# Patient Record
Sex: Male | Born: 1960 | Race: White | Hispanic: No | Marital: Single | Smoking: Current every day smoker
Health system: Southern US, Community
[De-identification: ages and names within clinical notes are randomized; demographics above are authoritative.]

## PROBLEM LIST (undated history)

## (undated) HISTORY — PX: FOOT SURGERY: SHX648

---

## 2020-06-18 ENCOUNTER — Emergency Department
Admission: EM | Admit: 2020-06-18 | Discharge: 2020-06-18 | Disposition: A | Discharge status: Home / Self Care | Payer: Self-pay | Attending: Emergency Medicine | Admitting: Emergency Medicine

## 2020-06-18 ENCOUNTER — Emergency Department: Payer: Self-pay

## 2020-06-18 ENCOUNTER — Other Ambulatory Visit: Payer: Self-pay

## 2020-06-18 DIAGNOSIS — M79601 Pain in right arm: Secondary | ICD-10-CM | POA: Insufficient documentation

## 2020-06-18 DIAGNOSIS — M542 Cervicalgia: Secondary | ICD-10-CM | POA: Insufficient documentation

## 2020-06-18 DIAGNOSIS — M79602 Pain in left arm: Secondary | ICD-10-CM | POA: Insufficient documentation

## 2020-06-18 DIAGNOSIS — Y92410 Unspecified street and highway as the place of occurrence of the external cause: Secondary | ICD-10-CM | POA: Insufficient documentation

## 2020-06-18 DIAGNOSIS — M79652 Pain in left thigh: Secondary | ICD-10-CM | POA: Insufficient documentation

## 2020-06-18 DIAGNOSIS — R519 Headache, unspecified: Secondary | ICD-10-CM | POA: Insufficient documentation

## 2020-06-18 DIAGNOSIS — M545 Low back pain, unspecified: Secondary | ICD-10-CM | POA: Insufficient documentation

## 2020-06-18 DIAGNOSIS — M79651 Pain in right thigh: Secondary | ICD-10-CM | POA: Insufficient documentation

## 2020-06-18 MED ORDER — ACETAMINOPHEN 325 MG PO TABS
650.0000 mg | ORAL_TABLET | Freq: Once | ORAL | Status: AC
  Administered 2020-06-18: 650 mg via ORAL
  Filled 2020-06-18: qty 2

## 2020-06-18 MED ORDER — METHOCARBAMOL 500 MG PO TABS
750.0000 mg | ORAL_TABLET | Freq: Once | ORAL | Status: AC
  Administered 2020-06-18: 750 mg via ORAL
  Filled 2020-06-18: qty 2

## 2020-06-18 MED ORDER — MELOXICAM 15 MG PO TABS: 15.0000 mg | ORAL_TABLET | Freq: Every day | ORAL | 0 refills | Status: AC

## 2020-06-18 MED ORDER — METHOCARBAMOL 750 MG PO TABS: 750.0000 mg | ORAL_TABLET | Freq: Four times a day (QID) | ORAL | 0 refills | Status: AC | PRN

## 2020-06-18 MED ORDER — MELOXICAM 7.5 MG PO TABS
15.0000 mg | ORAL_TABLET | Freq: Once | ORAL | Status: AC
  Administered 2020-06-18: 15 mg via ORAL
  Filled 2020-06-18: qty 2

## 2020-06-18 NOTE — Discharge Instructions (Signed)
Please take the medications as prescribed.  You may also use Tylenol, up to 4 times daily as needed for pain.  Please follow-up with primary care if you fail to improve, or return to the ER for any worsening of symptoms.
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
## 2020-06-18 NOTE — ED Notes (Signed)
Pt is standing at bedside when this RN entered room. Pt is acting anxious and questioning why he has a HA and what medications he was being given. I went over the medication and discussed what the medication was used for. Pt was curious on why he was not receiving anything stronger for a HA. I stated that typically HA are treat with non-opoid drugs. Pt stated that he and his significant other would seek care at another hospital to gain stronger medication for her HA.
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
## 2020-09-26 ENCOUNTER — Other Ambulatory Visit: Payer: Self-pay

## 2020-09-26 ENCOUNTER — Ambulatory Visit: Payer: Self-pay | Admitting: Family Medicine

## 2020-09-26 ENCOUNTER — Other Ambulatory Visit: Payer: Self-pay | Admitting: Family Medicine

## 2020-09-26 ENCOUNTER — Encounter: Payer: Self-pay | Admitting: Family Medicine

## 2020-09-26 VITALS — BP 162/84 | HR 88 | Ht 70.0 in | Wt 191.0 lb

## 2020-09-26 DIAGNOSIS — Z7689 Persons encountering health services in other specified circumstances: Secondary | ICD-10-CM

## 2020-09-26 DIAGNOSIS — Z125 Encounter for screening for malignant neoplasm of prostate: Secondary | ICD-10-CM

## 2020-09-26 DIAGNOSIS — M159 Polyosteoarthritis, unspecified: Secondary | ICD-10-CM

## 2020-09-26 DIAGNOSIS — M8949 Other hypertrophic osteoarthropathy, multiple sites: Secondary | ICD-10-CM | POA: Insufficient documentation

## 2020-09-26 DIAGNOSIS — Z0000 Encounter for general adult medical examination without abnormal findings: Secondary | ICD-10-CM

## 2020-09-26 DIAGNOSIS — Z13220 Encounter for screening for lipoid disorders: Secondary | ICD-10-CM

## 2020-09-26 DIAGNOSIS — Z131 Encounter for screening for diabetes mellitus: Secondary | ICD-10-CM
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
## 2021-03-04 ENCOUNTER — Emergency Department
Admission: EM | Admit: 2021-03-04 | Discharge: 2021-03-04 | Disposition: A | Discharge status: Home / Self Care | Payer: Self-pay | Attending: Emergency Medicine | Admitting: Emergency Medicine

## 2021-03-04 ENCOUNTER — Other Ambulatory Visit: Payer: Self-pay

## 2021-03-04 ENCOUNTER — Emergency Department: Payer: Self-pay

## 2021-03-04 DIAGNOSIS — R609 Edema, unspecified: Secondary | ICD-10-CM

## 2021-03-04 DIAGNOSIS — N451 Epididymitis: Secondary | ICD-10-CM | POA: Insufficient documentation

## 2021-03-04 DIAGNOSIS — N5089 Other specified disorders of the male genital organs: Secondary | ICD-10-CM

## 2021-03-04 LAB — URINALYSIS, ROUTINE W REFLEX MICROSCOPIC
Bilirubin Urine: NEGATIVE
Glucose, UA: 250 mg/dL — AB
Nitrite: NEGATIVE
Protein, ur: 30 mg/dL — AB
Specific Gravity, Urine: 1.02 (ref 1.005–1.030)
pH: 5.5 (ref 5.0–8.0)

## 2021-03-04 LAB — CBC WITH DIFFERENTIAL/PLATELET
Abs Immature Granulocytes: 0.14 10*3/uL — ABNORMAL HIGH (ref 0.00–0.07)
Basophils Absolute: 0.1 10*3/uL (ref 0.0–0.1)
Basophils Relative: 0 %
Eosinophils Absolute: 0.2 10*3/uL (ref 0.0–0.5)
Eosinophils Relative: 1 %
HCT: 45.9 % (ref 39.0–52.0)
Hemoglobin: 15.7 g/dL (ref 13.0–17.0)
Immature Granulocytes: 1 %
Lymphocytes Relative: 14 %
Lymphs Abs: 2.5 10*3/uL (ref 0.7–4.0)
MCH: 32.1 pg (ref 26.0–34.0)
MCHC: 34.2 g/dL (ref 30.0–36.0)
MCV: 93.9 fL (ref 80.0–100.0)
Monocytes Absolute: 1.4 10*3/uL — ABNORMAL HIGH (ref 0.1–1.0)
Monocytes Relative: 8 %
Neutro Abs: 14 10*3/uL — ABNORMAL HIGH (ref 1.7–7.7)
Neutrophils Relative %: 76 %
Platelets: 377 10*3/uL (ref 150–400)
RBC: 4.89 MIL/uL (ref 4.22–5.81)
RDW: 11.9 % (ref 11.5–15.5)
WBC: 18.2 10*3/uL — ABNORMAL HIGH (ref 4.0–10.5)
nRBC: 0 % (ref 0.0–0.2)

## 2021-03-04 LAB — BASIC METABOLIC PANEL
Anion gap: 7 (ref 5–15)
BUN: 21 mg/dL (ref 8–23)
CO2: 26 mmol/L (ref 22–32)
Calcium: 8.5 mg/dL — ABNORMAL LOW (ref 8.9–10.3)
Chloride: 102 mmol/L (ref 98–111)
Creatinine, Ser: 0.91 mg/dL (ref 0.61–1.24)
GFR, Estimated: >60 mL/min (ref >60)
Glucose, Bld: 143 mg/dL — ABNORMAL HIGH (ref 70–99)
Potassium: 3.4 mmol/L — ABNORMAL LOW (ref 3.5–5.1)
Sodium: 135 mmol/L (ref 135–145)

## 2021-03-04 LAB — HCG, QUANTITATIVE, PREGNANCY: hCG, Beta Chain, Quant, S: 1 m[IU]/mL (ref <5)

## 2021-03-04 LAB — LACTATE DEHYDROGENASE: LDH: 89 U/L — ABNORMAL LOW (ref 98–192)

## 2021-03-04 MED ORDER — STERILE WATER FOR INJECTION IJ SOLN
INTRAMUSCULAR | Status: AC
  Filled 2021-03-04: qty 10

## 2021-03-04 MED ORDER — OXYCODONE-ACETAMINOPHEN 5-325 MG PO TABS: 1.0000 | ORAL_TABLET | ORAL | 0 refills | Status: AC | PRN

## 2021-03-04 MED ORDER — CEFTRIAXONE SODIUM 1 G IJ SOLR
500.0000 mg | Freq: Once | INTRAMUSCULAR | Status: AC
  Administered 2021-03-04: 500 mg via INTRAMUSCULAR
  Filled 2021-03-04: qty 10

## 2021-03-04 MED ORDER — POTASSIUM CHLORIDE CRYS ER 20 MEQ PO TBCR
20.0000 meq | EXTENDED_RELEASE_TABLET | Freq: Once | ORAL | Status: AC
  Administered 2021-03-04: 20 meq via ORAL
  Filled 2021-03-04: qty 1

## 2021-03-04 MED ORDER — OXYCODONE-ACETAMINOPHEN 5-325 MG PO TABS
1.0000 | ORAL_TABLET | Freq: Once | ORAL | Status: AC
  Administered 2021-03-04: 1 via ORAL
  Filled 2021-03-04: qty 1

## 2021-03-04 MED ORDER — DOXYCYCLINE HYCLATE 100 MG PO CAPS: 100.0000 mg | ORAL_CAPSULE | Freq: Two times a day (BID) | ORAL | 0 refills | Status: AC

## 2021-03-04 NOTE — ED Triage Notes (Signed)
Pt presents via POV c/o left testicle swelling. Denies injury.

## 2021-03-04 NOTE — ED Provider Notes (Signed)
[facility] Provider Note    Event Date/Time   First MD Initiated Contact with Patient 03/04/21 2110     (approximate)   History   Chief Complaint Groin Swelling   HPI  Jonathan Lynn is a 61 y.o. male with no significant past medical history who presents to the ED complaining of groin swelling.  Patient reports that he has had 2 days of increasing pain and swelling to his left testicle.  He describes the pain as constant and throbbing, exacerbated with even light touch to his testicle.  He has not noticed any redness or skin lesions to the area.  He reports occasional dysuria but has not noticed any penile discharge or hematuria.  Pain seems to track upwards into the left side of his groin, but he denies any abdominal pain.  He has felt nauseous at times when the pain is severe, but denies any vomiting.  He has not had any changes in his bowel movements.  He denies any history of similar symptoms.      Physical Exam   Triage Vital Signs: ED Triage Vitals  Enc Vitals Group     BP 03/04/21 2050 (!) 194/87     Pulse Rate 03/04/21 2050 90     Resp 03/04/21 2050 16     Temp 03/04/21 2050 97.6 F (36.4 C)     Temp Source 03/04/21 2050 Oral     SpO2 03/04/21 2050 98 %     Weight --      Height --      Head Circumference --      Peak Flow --      Pain Score 03/04/21 2051 6     Pain Loc --      Pain Edu? --      Excl. in GC? --     Most recent vital signs: Vitals:   03/04/21 2050  BP: (!) 194/87  Pulse: 90  Resp: 16  Temp: 97.6 F (36.4 C)  SpO2: 98%    Constitutional: Alert and oriented. Eyes: Conjunctivae are normal. Head: Atraumatic. Nose: No congestion/rhinnorhea. Mouth/Throat: Mucous membranes are moist.  Cardiovascular: Normal rate, regular rhythm. Grossly normal heart sounds.  2+ radial pulses bilaterally. Respiratory: Normal respiratory effort.  No retractions. Lungs CTAB. Gastrointestinal: Soft and nontender. No  distention. Genitourinary: Edematous and tender left testicle with no erythema or warmth noted.  No skin lesions noted to scrotum or penis.  No hernia noted. Musculoskeletal: No lower extremity tenderness nor edema.  Neurologic:  Normal speech and language. No gross focal neurologic deficits are appreciated.    ED Results / Procedures / Treatments   Labs (all labs ordered are listed, but only abnormal results are displayed) Labs Reviewed  URINALYSIS, ROUTINE W REFLEX MICROSCOPIC - Abnormal; Notable for the following components:      Result Value   APPearance CLEAR (*)    Glucose, UA 250 (*)    Hgb urine dipstick SMALL (*)    Ketones, ur TRACE (*)    Protein, ur 30 (*)    Leukocytes,Ua SMALL (*)    Bacteria, UA FEW (*)    All other components within normal limits  CHLAMYDIA/NGC RT PCR ([facility] ONLY)            CBC WITH DIFFERENTIAL/PLATELET  BASIC METABOLIC PANEL  HCG, QUANTITATIVE, PREGNANCY  AFP TUMOR MARKER  LACTATE DEHYDROGENASE    RADIOLOGY Scrotal ultrasound reviewed by me with apparent nonvascular mass.  PROCEDURES:  Critical Care performed:  No  Procedures   MEDICATIONS ORDERED IN ED: Medications  oxyCODONE-acetaminophen (PERCOCET/ROXICET) 5-325 MG per tablet 1 tablet (1 tablet Oral Given 03/04/21 2224)  cefTRIAXone (ROCEPHIN) injection 500 mg (500 mg Intramuscular Given 03/04/21 2238)  sterile water (preservative free) injection (  Given 03/04/21 2244)     IMPRESSION / MDM / ASSESSMENT AND PLAN / ED COURSE  I reviewed the triage vital signs and the nursing notes.                              61 y.o. male with no significant past medical history presents to the ED complaining of increasing pain and swelling around his left testicle over the past 2 days.  Differential diagnosis includes, but is not limited to, epididymitis, torsion, scrotal mass, cellulitis, abscess, inguinal hernia.  Patient nontoxic-appearing and in no acute distress, does have significantly  swollen testicle that is firm to the touch and diffusely tender.  No overlying signs of cellulitis or abscess, no palpable inguinal hernia noted.  Ultrasound is significant for epididymitis as well as apparent nonvascular mass, potentially representing malignancy.  Findings were discussed with Dr. Erlene Quan of urology, who recommends screening tumor markers and proceeding with treatment for epididymitis.  Dr. Erlene Quan will assist with close follow-up in the urology office.  Patient given dose of IM Rocephin and prescribed doxycycline to take at home along with pain medication as needed.  He was counseled to return to the ED for new worsening symptoms, patient agrees with plan.       FINAL CLINICAL IMPRESSION(S) / ED DIAGNOSES   Final diagnoses:  Swelling  Testicular swelling  Epididymitis     Rx / DC Orders   ED Discharge Orders          Ordered    doxycycline (VIBRAMYCIN) 100 MG capsule  2 times daily        03/04/21 2250    oxyCODONE-acetaminophen (PERCOCET) 5-325 MG tablet  Every 4 hours PRN        03/04/21 2250             Note:  This document was prepared using Dragon voice recognition software and may include unintentional dictation errors.   Blake Divine, MD 03/04/21 2255

## 2021-03-05 LAB — CHLAMYDIA/NGC RT PCR (ARMC ONLY)
Chlamydia Tr: NOT DETECTED
N gonorrhoeae: NOT DETECTED

## 2021-03-06 LAB — AFP TUMOR MARKER: AFP, Serum, Tumor Marker: <1.8 ng/mL (ref 0.0–8.4)

## 2021-03-12 NOTE — Progress Notes (Incomplete)
° °  03/12/21 4:03 PM   Jonathan Lynn November 17, 1960 [id number]  Referring provider:  Smitty Cords, DO [address] [place],  [place] [address] No chief complaint on file.    HPI: Jonathan Lynn is a 61 y.o.male for further evaluation of testicular swelling.   He was seen in the ED on 03/04/2021 for testicular swelling of his left testicle.  US scrotum revealed Asymmetrically larger and hyperemic left epididymis consistent with epididymitis. No findings suspect coexisting orchitis at this time. Small to moderate size complex left scrotal hydrocele, with multiple thin septations. 9.4 x 11.6 x 11.2 mm cystic lesion in the lower lateral left testicle, with some images suggesting possible at least partially solid architecture but with no detectable color flow. UA revealed small Hgb, trace ketones, small leukocytes, and few bacteria. Findings were consistent with epididymitis.       PMH: No past medical history on file.  Surgical History: Past Surgical History:  Procedure Laterality Date   FOOT SURGERY Right     Home Medications:  Allergies as of 03/13/2021   No Known Allergies      Medication List        Accurate as of March 12, 2021  4:03 PM. If you have any questions, ask your nurse or doctor.          oxyCODONE-acetaminophen 5-325 MG tablet Commonly known as: Percocet Take 1 tablet by mouth every 4 (four) hours as needed for severe pain.        Allergies: No Known Allergies  Family History: No family history on file.  Social History:  reports that he has been smoking cigarettes. He has a 10.00 pack-year smoking history. He has never used smokeless tobacco. He reports current alcohol use of about 1.0 standard drink per week. No history on file for drug use.   Physical Exam: There were no vitals taken for this visit.  Constitutional:  Alert and oriented, No acute distress. HEENT: [place] AT, moist mucus membranes.  Trachea midline, no masses. Cardiovascular: No  clubbing, cyanosis, or edema. Respiratory: Normal respiratory effort, no increased work of breathing. Skin: No rashes, bruises or suspicious lesions. Neurologic: Grossly intact, no focal deficits, moving all 4 extremities. Psychiatric: Normal mood and affect.  Laboratory Data:  Lab Results  Component Value Date   CREATININE 0.91 03/04/2021    Urinalysis   Pertinent Imaging:   Assessment & Plan:     No follow-ups on file.  I,Kailey Littlejohn,acting as a Neurosurgeon for Vanna Scotland, MD.,have documented all relevant documentation on the behalf of Vanna Scotland, MD,as directed by  Vanna Scotland, MD while in the presence of Vanna Scotland, MD.   [facility] [address], [address] [place], [place] [address] [phone]

## 2021-03-13 ENCOUNTER — Ambulatory Visit: Payer: Self-pay | Admitting: Urology

## 2021-03-15 ENCOUNTER — Encounter: Payer: Self-pay | Admitting: Urology

## 2021-03-20 NOTE — Progress Notes (Incomplete)
03/20/21 1:05 PM   Jonathan Lynn 07-28-1960 [id number]  Referring provider:  Smitty Cords, DO [address] [place],  [place] [address] No chief complaint on file.    HPI: Jonathan Lynn is a 61 y.o.male who presents today for further evaluation of testicular swelling.   He was seen in the ED for groin swelling. Urinalysis showed small Hgb, small leukocytes, and few bacteria. Scrotal ultrasound visualized Asymmetrically larger and hyperemic left epididymis consistent with epididymitis. No findings suspect coexisting orchitis at this time. Small to moderate size complex left scrotal hydrocele, with multiple thin septations. 9.4 x 11.6 x 11.2 mm cystic lesion in the lower lateral left testicle, with some images suggesting possible at least partially solid architecture but with no detectable color flow. Findings were discussed with me and I recommended screening tumor markers and proceeding with treatment for epididymitis. He was given dose of IM Rocephin and prescribed doxycycline to take at home along with pain medication as needed.     PMH: No past medical history on file.  Surgical History: Past Surgical History:  Procedure Laterality Date   FOOT SURGERY Right     Home Medications:  Allergies as of 03/21/2021   No Known Allergies      Medication List        Accurate as of March 20, 2021  1:05 PM. If you have any questions, ask your nurse or doctor.          oxyCODONE-acetaminophen 5-325 MG tablet Commonly known as: Percocet Take 1 tablet by mouth every 4 (four) hours as needed for severe pain.        Allergies: No Known Allergies  Family History: No family history on file.  Social History:  reports that he has been smoking cigarettes. He has a 10.00 pack-year smoking history. He has never used smokeless tobacco. He reports current alcohol use of about 1.0 standard drink per week. No history on file for drug use.   Physical Exam: There were no vitals  taken for this visit.  Constitutional:  Alert and oriented, No acute distress. HEENT: [place] AT, moist mucus membranes.  Trachea midline, no masses. Cardiovascular: No clubbing, cyanosis, or edema. Respiratory: Normal respiratory effort, no increased work of breathing. Skin: No rashes, bruises or suspicious lesions. Neurologic: Grossly intact, no focal deficits, moving all 4 extremities. Psychiatric: Normal mood and affect.  Laboratory Data:  Lab Results  Component Value Date   CREATININE 0.91 03/04/2021   Urinalysis   Pertinent Imaging: CLINICAL DATA:  Left scrotal pain and swelling.   EXAM: SCROTAL ULTRASOUND   DOPPLER ULTRASOUND OF THE TESTICLES   TECHNIQUE: Complete ultrasound examination of the testicles, epididymis, and other scrotal structures was performed. Color and spectral Doppler ultrasound were also utilized to evaluate blood flow to the testicles.   COMPARISON:  None.   FINDINGS: Right testicle   Measurements: 3.5 x 1.7 x 2.6. No mass or microlithiasis visualized.   Left testicle   Measurements: 3.9 x 2.4 x 3.0 cm. No microlithiasis visualized. In the lateral aspect of the left testicle, centered inferiorly, there is 9.4 x 11.6 x 11.2 mm cystic lesion with a well-defined border and through transmission. No color flow is seen within it but some images suggest at least a partially solid architecture to this versus floating internal debris.   Right epididymis:  Normal in size and appearance.   Left epididymis:  Asymmetrically larger and hyperemic.   Hydrocele: None visualized on the right. On the left, there  is a small to moderate-size complex scrotal hydrocele with multiple thin septations, with no color flow detected in the septations.   Varicocele:  None visualized.   Pulsed Doppler interrogation of both testes demonstrates normal low resistance arterial and venous waveforms bilaterally.   IMPRESSION: 1. Asymmetrically larger and hyperemic left  epididymis consistent with epididymitis. No findings suspect coexisting orchitis at this time. 2. Small to moderate size complex left scrotal hydrocele, with multiple thin septations. 3. 9.4 x 11.6 x 11.2 mm cystic lesion in the lower lateral left testicle, with some images suggesting possible at least partially solid architecture but with no detectable color flow. Both benign and malignant etiologies are possible. Urology consult is recommended.     Electronically Signed   By: Almira Bar M.D.   On: 03/04/2021 22:03     Assessment & Plan:     No follow-ups on file.  I,Kailey Littlejohn,acting as a Neurosurgeon for Vanna Scotland, MD.,have documented all relevant documentation on the behalf of Vanna Scotland, MD,as directed by  Vanna Scotland, MD while in the presence of Vanna Scotland, MD.  [facility] [address], [address] [place], [place] [address] [phone]

## 2021-03-21 ENCOUNTER — Ambulatory Visit: Payer: Self-pay | Admitting: Urology

## 2021-03-22 ENCOUNTER — Ambulatory Visit: Payer: Self-pay | Admitting: Urology

## 2021-04-05 ENCOUNTER — Encounter: Payer: Self-pay | Admitting: Urology

## 2021-04-05 ENCOUNTER — Ambulatory Visit (INDEPENDENT_AMBULATORY_CARE_PROVIDER_SITE_OTHER): Payer: Self-pay | Admitting: Urology

## 2021-04-05 ENCOUNTER — Other Ambulatory Visit: Payer: Self-pay

## 2021-04-05 VITALS — BP 175/105 | HR 92 | Ht 70.0 in | Wt 188.0 lb

## 2021-04-05 DIAGNOSIS — N451 Epididymitis: Secondary | ICD-10-CM

## 2021-04-05 DIAGNOSIS — N5089 Other specified disorders of the male genital organs: Secondary | ICD-10-CM

## 2021-04-05 DIAGNOSIS — N50819 Testicular pain, unspecified: Secondary | ICD-10-CM

## 2021-04-05 LAB — MICROSCOPIC EXAMINATION
Bacteria, UA: NONE SEEN
Epithelial Cells (non renal): NONE SEEN /hpf (ref 0–10)

## 2021-04-05 LAB — URINALYSIS, COMPLETE
Bilirubin, UA: NEGATIVE
Ketones, UA: NEGATIVE
Leukocytes,UA: NEGATIVE
Nitrite, UA: NEGATIVE
Protein,UA: NEGATIVE
RBC, UA: NEGATIVE
Specific Gravity, UA: 1.025 (ref 1.005–1.030)
Urobilinogen, Ur: 0.2 mg/dL (ref 0.2–1.0)
pH, UA: 7 (ref 5.0–7.5)

## 2021-04-05 MED ORDER — DOXYCYCLINE HYCLATE 100 MG PO CAPS: 100.0000 mg | ORAL_CAPSULE | Freq: Two times a day (BID) | ORAL | 0 refills | Status: AC

## 2021-04-05 NOTE — Progress Notes (Signed)
° °  04/05/2021 12:17 PM   Jonathan Lynn 12/12/1960 [id number]  Referring provider: Olin Hauser, DO [address] [place],  [place] [address]  Chief Complaint  Patient presents with   Testicle Pain    HPI: Jonathan Lynn is a 61 y.o. male who presents for follow-up of recent ED visit.    Seen [facility] ED 03/04/2021 complaining of left hemiscrotal swelling and pain 48 hours prior to presentation Exam remarkable for edematous and tender left hemiscrotum UA with 21-50 WBCs Negative chlamydia/GC Urine culture was not ordered Scrotal ultrasound remarkable for enlarged and hyperemic left epididymis.  Also noted to have a 9 x 11 x 11 mm cystic lesion inferior aspect left testis laterally with some images suggesting possible solid architecture but no color flow was detected Was treated with a dose of IM ceftriaxone 7-day course of doxycycline Testicular tumor markers were drawn which were negative Since his ED visit scrotal pain and swelling have resolved though he states there is still some firmness present No bothersome LUTS  PMH: Degenerative arthritis  Surgical History: Past Surgical History:  Procedure Laterality Date   FOOT SURGERY Right     Home Medications:  Allergies as of 04/05/2021   No Known Allergies      Medication List        Accurate as of April 05, 2021 12:17 PM. If you have any questions, ask your nurse or doctor.          oxyCODONE-acetaminophen 5-325 MG tablet Commonly known as: Percocet Take 1 tablet by mouth every 4 (four) hours as needed for severe pain.        Allergies: No Known Allergies  Family History: No family history on file.  Social History:  reports that he has been smoking cigarettes. He has a 10.00 pack-year smoking history. He has never used smokeless tobacco. He reports current alcohol use of about 1.0 standard drink per week. No history on file for drug use.   Physical Exam: BP (!) 175/105    Pulse 92    Ht 5\' 10"  (1.778  m)    Wt 188 lb (85.3 kg)    BMI 26.98 kg/m   Constitutional:  Alert and oriented, No acute distress. HEENT: [place] AT, moist mucus membranes.  Trachea midline, no masses. Cardiovascular: No clubbing, cyanosis, or edema. Respiratory: Normal respiratory effort, no increased work of breathing. GI: Abdomen is soft, nontender, nondistended, no abdominal masses GU: Phallus without lesions.  No scrotal swelling, skin thickness or erythema.  Right testis descended and palpably normal.  Left epididymis/testis indurated Skin: No rashes, bruises or suspicious lesions. Neurologic: Grossly intact, no focal deficits, moving all 4 extremities. Psychiatric: Normal mood and affect.  Laboratory Data:  Urinalysis Dipstick trace glucose/microscopy negative   Pertinent Imaging: Scrotal ultrasound images were personally reviewed and interpreted   Assessment & Plan:   61 y.o. male with recent episode of left epididymitis with pyuria and findings consistent with epididymitis on scrotal ultrasound. Symptoms resolved and UA normal today Scrotal ultrasound showed an intratesticular cystic mass with questionable solid components.  Exam today still with induration which could be secondary to prior episode epididymitis Schedule follow-up scrotal ultrasound.  He will be notified with results   Abbie Sons, [place] [address] [place], [place] [address] [phone]

## 2021-04-07 ENCOUNTER — Encounter: Payer: Self-pay | Admitting: Urology

## 2021-05-19 ENCOUNTER — Telehealth: Payer: Self-pay | Admitting: Urology

## 2021-05-31 NOTE — Telephone Encounter (Signed)
I recommended a repeat scrotal ultrasound when I saw patient in January.  It was ordered.  Will you please check on status.  I imagine radiology could not get in touch with him.  If they could not get in touch with him or he declined to schedule will you forward to [facility] and ask her to send a letter to send certified.  Thanks ?

## 2021-06-03 ENCOUNTER — Encounter: Payer: Self-pay | Admitting: Urology
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
## 2023-03-31 IMAGING — US US SCROTUM W/ DOPPLER COMPLETE
1 series · 13 of 25 positions shown · non-contrast
Comparison: None.

CLINICAL DATA: Left scrotal pain and swelling.

EXAM:
SCROTAL ULTRASOUND
DOPPLER ULTRASOUND OF THE TESTICLES
TECHNIQUE: Complete ultrasound examination of the testicles, epididymis, and
other scrotal structures was performed. Color and spectral Doppler
ultrasound were also utilized to evaluate blood flow to the
testicles.

[Series 1: us scrotum w/doppler · 49 acquisitions, 13 frames shown]
[im 1/49]
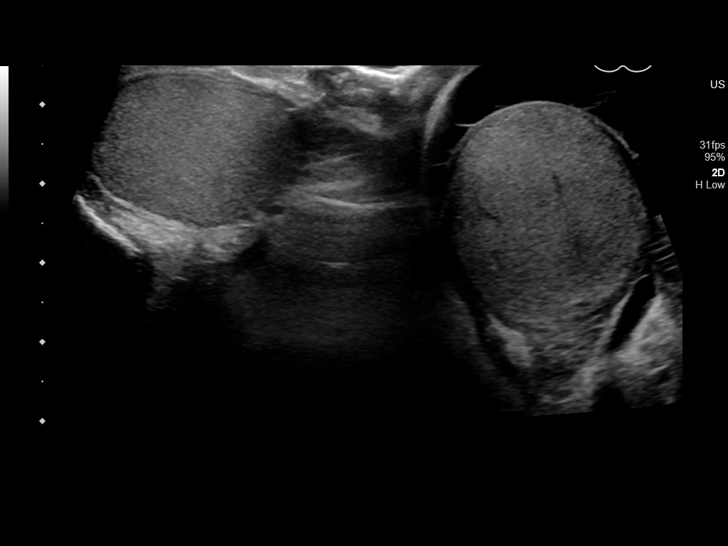
[im 5/49]
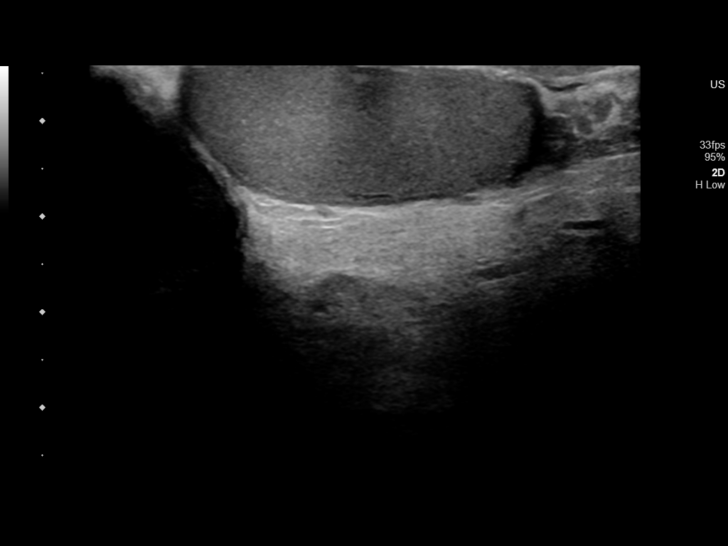
[im 9/49]
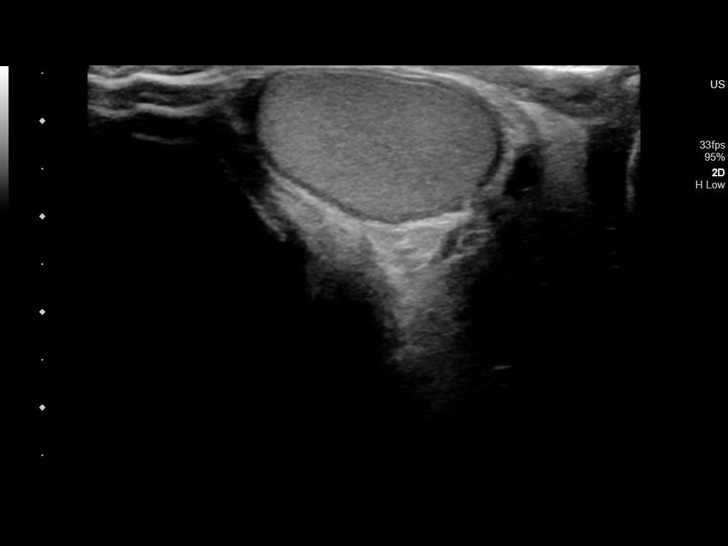
[im 13/49]
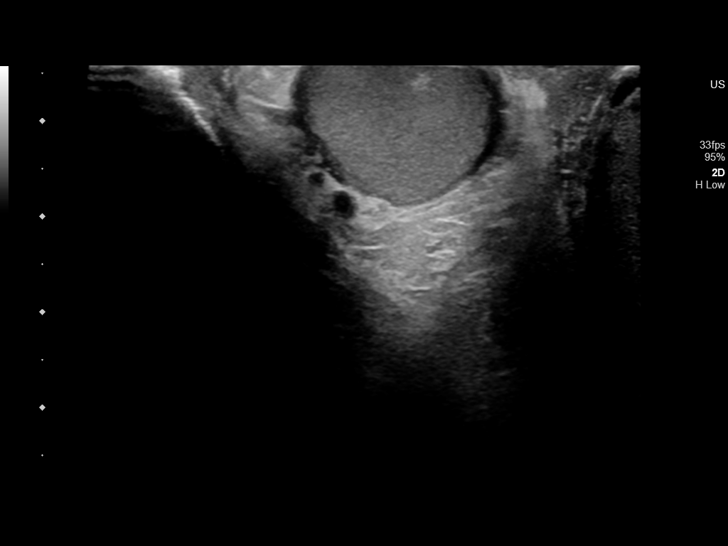
[im 17/49]
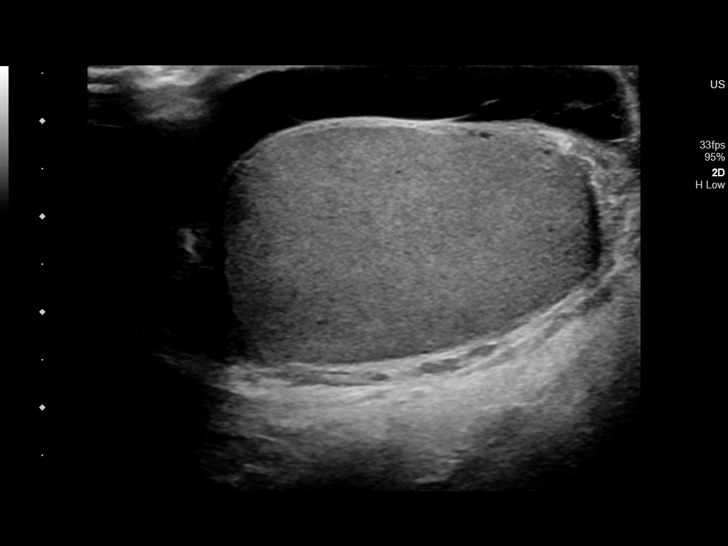
[im 21/49]
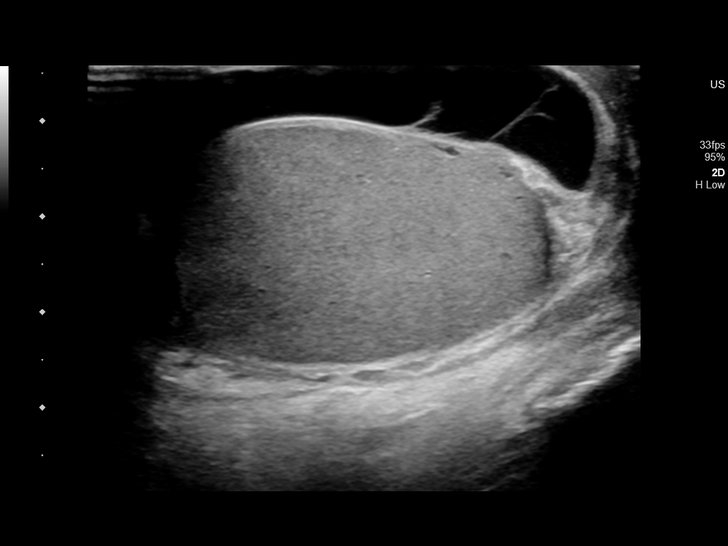
[im 25/49]
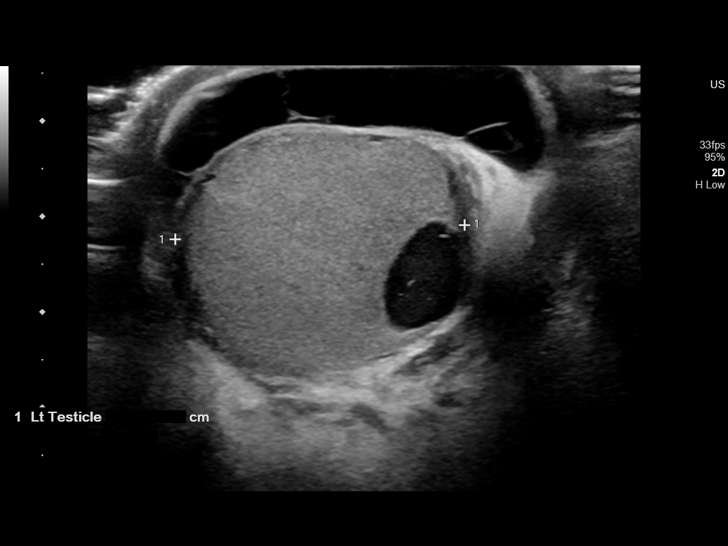
[im 29/49]
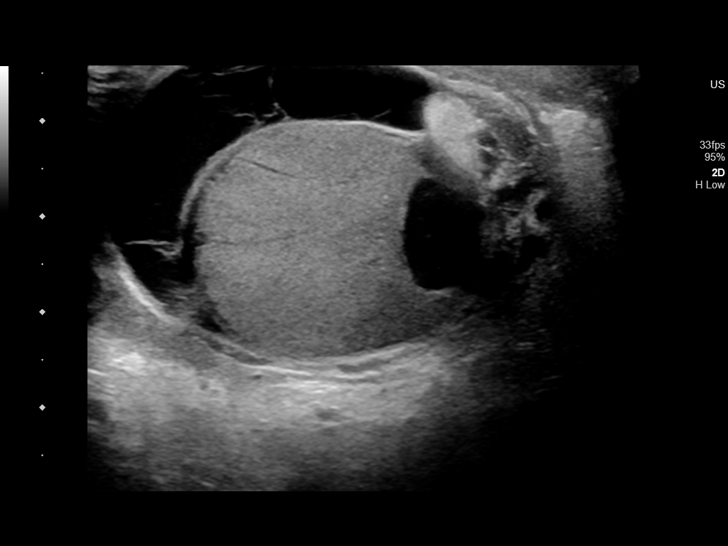
[im 33/49]
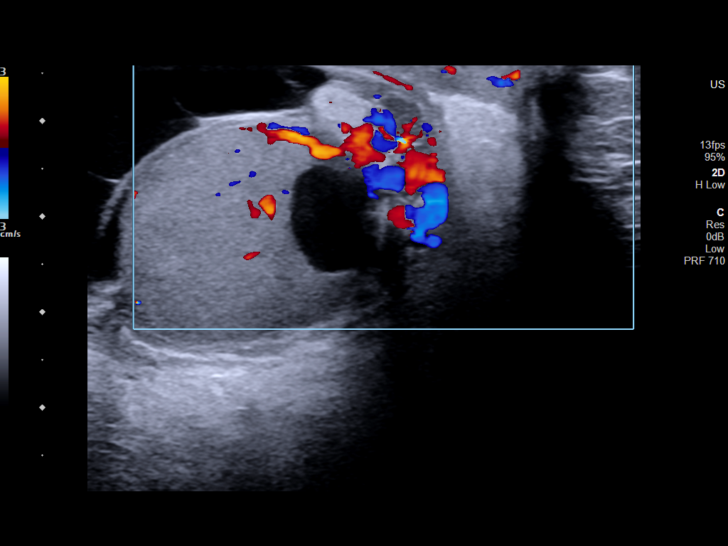
[im 37/49]
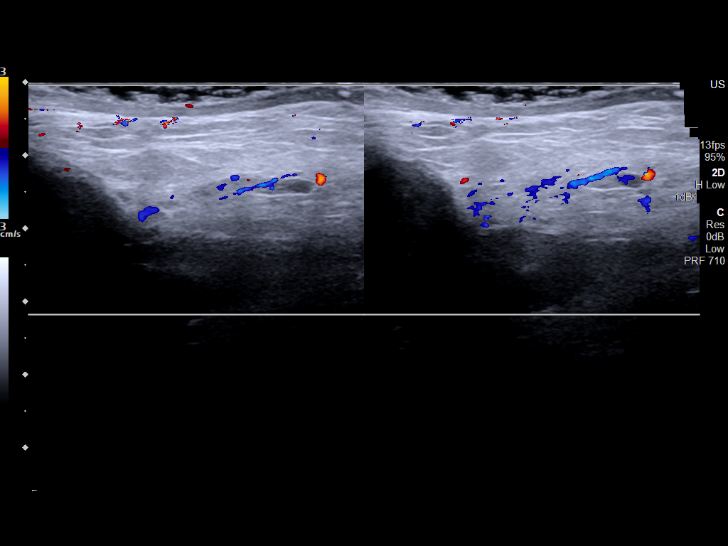
[im 41/49]
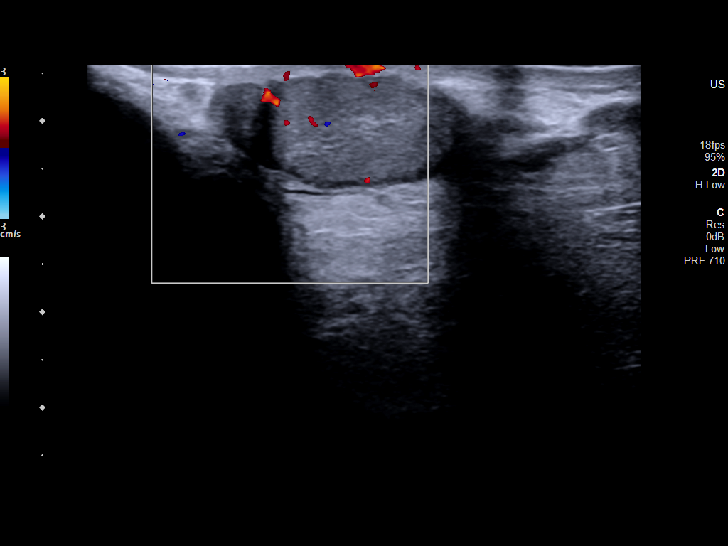
[im 45/49]
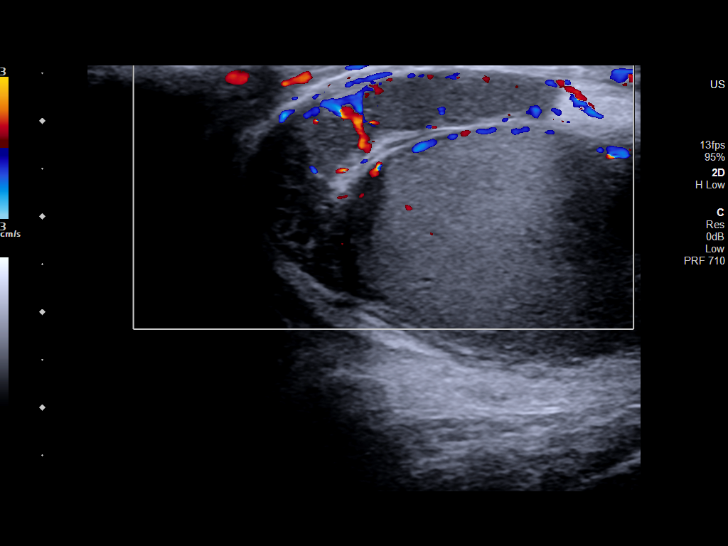
[im 49/49]
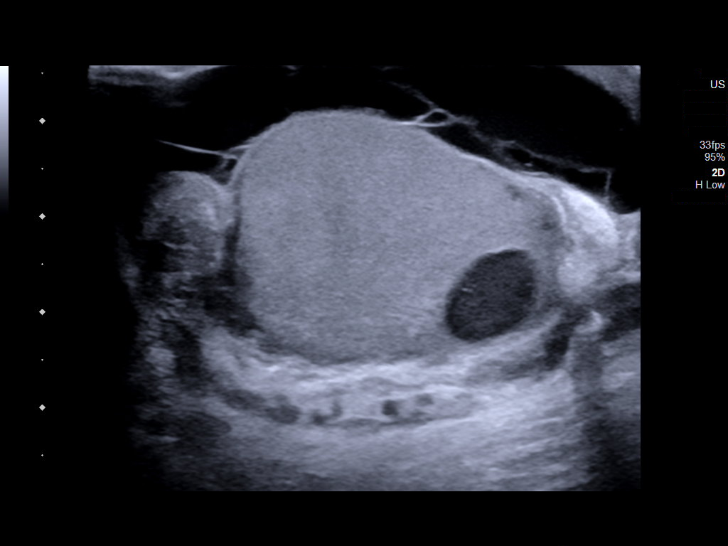

[13 of 25 positions shown; findings below may reference images not displayed]

FINDINGS: Right testicle

Measurements: 3.5 x 1.7 x 2.6. No mass or microlithiasis visualized.

Left testicle

Measurements: 3.9 x 2.4 x 3.0 cm. No microlithiasis visualized. In
the lateral aspect of the left testicle, centered inferiorly, there
is 9.4 x 11.6 x 11.2 mm cystic lesion with a well-defined border and
through transmission. No color flow is seen within it but some
images suggest at least a partially solid architecture to this
versus floating internal debris.

Right epididymis:  Normal in size and appearance.

Left epididymis:  Asymmetrically larger and hyperemic.

Hydrocele: None visualized on the right. On the left, there is a
small to moderate-size complex scrotal hydrocele with multiple thin
septations, with no color flow detected in the septations.

Varicocele:  None visualized.

Pulsed Doppler interrogation of both testes demonstrates normal low
resistance arterial and venous waveforms bilaterally.
IMPRESSION: 1. Asymmetrically larger and hyperemic left epididymis consistent
with epididymitis. No findings suspect coexisting orchitis at this
time.
2. Small to moderate size complex left scrotal hydrocele, with
multiple thin septations.
3. 9.4 x 11.6 x 11.2 mm cystic lesion in the lower lateral left
testicle, with some images suggesting possible at least partially
solid architecture but with no detectable color flow. Both benign
and malignant etiologies are possible. Urology consult is
recommended.
